# Patient Record
Sex: Male | Born: 2000 | Race: White | Hispanic: No | Marital: Single | Smoking: Never smoker
Health system: Southern US, Community
[De-identification: ages and names within clinical notes are randomized; demographics above are authoritative.]

---
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
## 2021-04-26 DIAGNOSIS — F902 Attention-deficit hyperactivity disorder, combined type: Secondary | ICD-10-CM | POA: Diagnosis not present

## 2021-04-26 DIAGNOSIS — Z79899 Other long term (current) drug therapy: Secondary | ICD-10-CM | POA: Diagnosis not present

## 2021-07-30 DIAGNOSIS — F419 Anxiety disorder, unspecified: Secondary | ICD-10-CM | POA: Diagnosis not present

## 2021-07-30 DIAGNOSIS — R450 Nervousness: Secondary | ICD-10-CM | POA: Diagnosis not present

## 2021-07-30 DIAGNOSIS — Z1331 Encounter for screening for depression: Secondary | ICD-10-CM | POA: Diagnosis not present

## 2021-09-21 ENCOUNTER — Emergency Department (HOSPITAL_BASED_OUTPATIENT_CLINIC_OR_DEPARTMENT_OTHER): Payer: BC Managed Care – PPO | Admitting: Radiology

## 2021-09-21 ENCOUNTER — Emergency Department (HOSPITAL_BASED_OUTPATIENT_CLINIC_OR_DEPARTMENT_OTHER)
Admission: EM | Admit: 2021-09-21 | Discharge: 2021-09-21 | Disposition: A | Discharge status: Home / Self Care | Payer: BC Managed Care – PPO | Attending: Emergency Medicine | Admitting: Emergency Medicine

## 2021-09-21 ENCOUNTER — Encounter (HOSPITAL_BASED_OUTPATIENT_CLINIC_OR_DEPARTMENT_OTHER): Payer: Self-pay | Admitting: Emergency Medicine

## 2021-09-21 ENCOUNTER — Other Ambulatory Visit: Payer: Self-pay

## 2021-09-21 DIAGNOSIS — S93402A Sprain of unspecified ligament of left ankle, initial encounter: Secondary | ICD-10-CM | POA: Diagnosis not present

## 2021-09-21 DIAGNOSIS — X501XXA Overexertion from prolonged static or awkward postures, initial encounter: Secondary | ICD-10-CM | POA: Diagnosis not present

## 2021-09-21 DIAGNOSIS — S99812A Other specified injuries of left ankle, initial encounter: Secondary | ICD-10-CM | POA: Diagnosis not present

## 2021-09-21 DIAGNOSIS — S99912A Unspecified injury of left ankle, initial encounter: Secondary | ICD-10-CM | POA: Diagnosis not present

## 2021-09-21 DIAGNOSIS — M25572 Pain in left ankle and joints of left foot: Secondary | ICD-10-CM | POA: Diagnosis not present
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
## 2021-09-21 NOTE — Discharge Instructions (Addendum)
Your x-ray did not show any obvious fractures of your ankle today.  You may have a high-grade ankle sprain.  He may also have a small fracture we cannot visualize on initial x-rays.  Therefore I recommend that you keep off of this foot for 1 week, keep it elevated at home, keep applying ice and taking Tylenol and ibuprofen as needed.  We will treat this like a high-grade sprain.  You should be able to bear weight again after 7 days.  If you are not able to do that, you should call to schedule an appointment with an orthopedic doctor.
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
## 2021-11-30 DIAGNOSIS — R450 Nervousness: Secondary | ICD-10-CM | POA: Diagnosis not present

## 2021-11-30 DIAGNOSIS — Z1331 Encounter for screening for depression: Secondary | ICD-10-CM | POA: Diagnosis not present

## 2021-11-30 DIAGNOSIS — F419 Anxiety disorder, unspecified: Secondary | ICD-10-CM | POA: Diagnosis not present

## 2022-02-26 DIAGNOSIS — S0990XA Unspecified injury of head, initial encounter: Secondary | ICD-10-CM | POA: Diagnosis not present

## 2022-02-26 DIAGNOSIS — W1789XA Other fall from one level to another, initial encounter: Secondary | ICD-10-CM | POA: Diagnosis not present

## 2022-02-26 DIAGNOSIS — Z23 Encounter for immunization: Secondary | ICD-10-CM | POA: Diagnosis not present

## 2022-02-26 DIAGNOSIS — S0081XA Abrasion of other part of head, initial encounter: Secondary | ICD-10-CM | POA: Diagnosis not present

## 2022-03-07 DIAGNOSIS — H9041 Sensorineural hearing loss, unilateral, right ear, with unrestricted hearing on the contralateral side: Secondary | ICD-10-CM | POA: Diagnosis not present

## 2022-03-08 DIAGNOSIS — R450 Nervousness: Secondary | ICD-10-CM | POA: Diagnosis not present

## 2022-03-08 DIAGNOSIS — Z1331 Encounter for screening for depression: Secondary | ICD-10-CM | POA: Diagnosis not present

## 2022-03-08 DIAGNOSIS — F902 Attention-deficit hyperactivity disorder, combined type: Secondary | ICD-10-CM | POA: Diagnosis not present

## 2022-03-08 DIAGNOSIS — M4120 Other idiopathic scoliosis, site unspecified: Secondary | ICD-10-CM | POA: Diagnosis not present

## 2022-03-08 DIAGNOSIS — F419 Anxiety disorder, unspecified: Secondary | ICD-10-CM | POA: Diagnosis not present

## 2022-12-12 DIAGNOSIS — F411 Generalized anxiety disorder: Secondary | ICD-10-CM | POA: Diagnosis not present

## 2022-12-12 DIAGNOSIS — Z79899 Other long term (current) drug therapy: Secondary | ICD-10-CM | POA: Diagnosis not present

## 2023-01-08 DIAGNOSIS — Z79899 Other long term (current) drug therapy: Secondary | ICD-10-CM | POA: Diagnosis not present

## 2023-01-08 DIAGNOSIS — F411 Generalized anxiety disorder: Secondary | ICD-10-CM | POA: Diagnosis not present

## 2023-06-20 DIAGNOSIS — J209 Acute bronchitis, unspecified: Secondary | ICD-10-CM | POA: Diagnosis not present

## 2023-06-20 DIAGNOSIS — R058 Other specified cough: Secondary | ICD-10-CM | POA: Diagnosis not present

## 2023-06-20 DIAGNOSIS — R062 Wheezing: Secondary | ICD-10-CM | POA: Diagnosis not present
